# Patient Record
Sex: Female | Born: 1985 | Race: White | Hispanic: No | Marital: Single | Smoking: Current every day smoker
Health system: Southern US, Community
[De-identification: ages and names within clinical notes are randomized; demographics above are authoritative.]

---

## 2010-04-19 ENCOUNTER — Emergency Department (HOSPITAL_COMMUNITY): Admission: EM | Admit: 2010-04-19 | Discharge: 2010-04-19 | Payer: Self-pay | Admitting: Emergency Medicine

## 2013-01-25 ENCOUNTER — Ambulatory Visit: Payer: BC Managed Care – PPO

## 2013-01-25 ENCOUNTER — Ambulatory Visit (INDEPENDENT_AMBULATORY_CARE_PROVIDER_SITE_OTHER): Payer: BC Managed Care – PPO | Admitting: Family Medicine

## 2013-01-25 DIAGNOSIS — M533 Sacrococcygeal disorders, not elsewhere classified: Secondary | ICD-10-CM

## 2013-01-25 DIAGNOSIS — N912 Amenorrhea, unspecified: Secondary | ICD-10-CM

## 2013-01-25 DIAGNOSIS — M79609 Pain in unspecified limb: Secondary | ICD-10-CM

## 2013-01-25 MED ORDER — HYDROCODONE-ACETAMINOPHEN 5-325 MG PO TABS: 1.0000 | ORAL_TABLET | Freq: Four times a day (QID) | ORAL | Status: DC | PRN

## 2013-01-25 NOTE — Progress Notes (Signed)
   [address] [place] [address]   Phone [phone]  Subjective:    Patient ID: Julia Williamson, female    DOB: 08/25/1986, 27 y.o.   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  She also has pain in her L thenar eminence from when fell during a 5 mile run and landed on her hand.  She only has pain at her thenar eminence and bruising - she has no pain in the rest of her hand or wrist.    She is in a bodybuilding show in 7 weeks.  She has missed her menses but has had a neg pregnancy test - her nutritionist thinks probably related to 30# weight loss in 17 weeks with rapid muscle build with body building regimen and extreme diet changes over these last 17 wks.  She is also having problems with constipation as a result (so she is unsure whether she will have pain with passing a stool because she has not had one since Sunday)   Review of Systems  Musculoskeletal: Positive for arthralgias.       Objective:   Physical Exam  Vitals reviewed. Constitutional: She appears well-developed and well-nourished.  HENT:  Head: Normocephalic and atraumatic.  Right Ear: External ear normal.  Left Ear: External ear normal.  Musculoskeletal:       Back:       Hands: Skin: Skin is warm and dry.  Psychiatric: She has a normal mood and affect. Her behavior is normal. Judgment and thought content normal.   [facility] reading (PRIMARY) by  Dr. Neva Seat.  Normal hand - ? Anterior tilt of distal aspect of coccyx       Assessment & Plan:  Pain in hand, left - Plan: DG Hand Complete Left - contusion of thenar eminence. Pt will monitor and get a thumb spica if she continues to have pain.  Amenorrhea - Plan: POCT urine pregnancy - amenorrhea 2nd to nutritional/exercise  habits - pt to continue her nuvaring.  Coccyx pain - Plan: DG Sacrum/Coccyx, HYDROcodone-acetaminophen (NORCO/VICODIN) 5-325 MG per tablet - expect this to be a deep bone bruise possibly due to mal-position of coccyx possible related to recent fracture.  She has Naproxen that she should also use and then use the Norco for breakthrough pain.  Benny Lennert PA-C 01/26/2013 1:19 PM

## 2013-02-28 NOTE — Progress Notes (Signed)
Xray read and patient discussed with Ms. Weber. Agree with assessment and plan of care per her note.   

## 2013-07-02 ENCOUNTER — Ambulatory Visit (INDEPENDENT_AMBULATORY_CARE_PROVIDER_SITE_OTHER): Payer: BC Managed Care – PPO | Admitting: Family Medicine

## 2013-07-02 ENCOUNTER — Ambulatory Visit: Payer: Self-pay

## 2013-07-02 VITALS — BP 122/72 | HR 89 | Temp 98.6°F | Resp 18 | Ht 62.0 in | Wt 147.6 lb

## 2013-07-02 DIAGNOSIS — M79645 Pain in left finger(s): Secondary | ICD-10-CM

## 2013-07-02 DIAGNOSIS — T148XXA Other injury of unspecified body region, initial encounter: Secondary | ICD-10-CM

## 2013-07-02 DIAGNOSIS — M25522 Pain in left elbow: Secondary | ICD-10-CM

## 2013-07-02 DIAGNOSIS — M79642 Pain in left hand: Secondary | ICD-10-CM

## 2013-07-02 DIAGNOSIS — M533 Sacrococcygeal disorders, not elsewhere classified: Secondary | ICD-10-CM

## 2013-07-02 DIAGNOSIS — M79609 Pain in unspecified limb: Secondary | ICD-10-CM

## 2013-07-02 DIAGNOSIS — S0003XA Contusion of scalp, initial encounter: Secondary | ICD-10-CM

## 2013-07-02 DIAGNOSIS — S51002A Unspecified open wound of left elbow, initial encounter: Secondary | ICD-10-CM

## 2013-07-02 DIAGNOSIS — M25529 Pain in unspecified elbow: Secondary | ICD-10-CM

## 2013-07-02 DIAGNOSIS — S0093XA Contusion of unspecified part of head, initial encounter: Secondary | ICD-10-CM

## 2013-07-02 DIAGNOSIS — IMO0002 Reserved for concepts with insufficient information to code with codable children: Secondary | ICD-10-CM

## 2013-07-02 DIAGNOSIS — S51009A Unspecified open wound of unspecified elbow, initial encounter: Secondary | ICD-10-CM

## 2013-07-02 MED ORDER — HYDROCODONE-ACETAMINOPHEN 5-325 MG PO TABS: 1.0000 | ORAL_TABLET | Freq: Four times a day (QID) | ORAL | Status: AC | PRN

## 2013-07-02 MED ORDER — MUPIROCIN 2 % EX OINT: TOPICAL_OINTMENT | Freq: Every day | CUTANEOUS | Status: AC
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
## 2013-07-05 ENCOUNTER — Ambulatory Visit (INDEPENDENT_AMBULATORY_CARE_PROVIDER_SITE_OTHER): Payer: BC Managed Care – PPO | Admitting: Family Medicine

## 2013-07-05 VITALS — BP 102/58 | HR 79 | Temp 98.3°F | Resp 18 | Ht 62.0 in | Wt 144.0 lb

## 2013-07-05 DIAGNOSIS — T07XXXA Unspecified multiple injuries, initial encounter: Secondary | ICD-10-CM

## 2013-07-05 DIAGNOSIS — IMO0002 Reserved for concepts with insufficient information to code with codable children: Secondary | ICD-10-CM

## 2013-07-05 DIAGNOSIS — M25529 Pain in unspecified elbow: Secondary | ICD-10-CM

## 2013-07-05 DIAGNOSIS — S5002XS Contusion of left elbow, sequela: Secondary | ICD-10-CM

## 2013-07-05 DIAGNOSIS — M25522 Pain in left elbow: Secondary | ICD-10-CM

## 2013-07-05 NOTE — Progress Notes (Signed)
Subjective: Patient is returning today for a reevaluation of her injuries. Her various scrapes aren't doing better, though she still has a lot of aches and pains. The left elbow continues to be the source of primary pain, especially over the proximal ulna. She has been wearing the splint on the left arm and keeping it in a sling. 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  Objective: Left hand seems adequate tenderness improved. The forearm looks good. The elbow has a deeper abrasion on it, as well as a superficial 1 that is almost resolved. She has quite tender over the proximal ulna. X-ray reports were reviewed, and there is no definite fracture. The only questionable area was the radial neck, and did not appear to be a fracture according to the radiologist's. That area is nontender I do not feel like additional evaluation of it is indicated at this time.  Assessment: Left elbow contusion and pain Multiple abrasions, especially left elbow  Plan: If she continues to hurt significantly in the elbow by this time next week she is to return to consider repeat x-rays. Continue local care of wounds. We will leave the splint off the arm and let her start working it more.

## 2013-07-05 NOTE — Patient Instructions (Signed)
Continue keeping the wound dressed for a few more days until it is starting to heal a little better, then leave it open to the air.  Stay off work for the next 4 days  Return if elbow continues to cause pain so it can be reassessed  Return sooner if any other problems.  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

## 2013-10-13 ENCOUNTER — Emergency Department (INDEPENDENT_AMBULATORY_CARE_PROVIDER_SITE_OTHER)
Admission: EM | Admit: 2013-10-13 | Discharge: 2013-10-13 | Disposition: A | Discharge status: Home / Self Care | Payer: Worker's Compensation | Source: Home / Self Care | Attending: Emergency Medicine | Admitting: Emergency Medicine

## 2013-10-13 ENCOUNTER — Encounter (HOSPITAL_COMMUNITY): Payer: Self-pay | Admitting: Emergency Medicine

## 2013-10-13 ENCOUNTER — Emergency Department (INDEPENDENT_AMBULATORY_CARE_PROVIDER_SITE_OTHER): Payer: Worker's Compensation

## 2013-10-13 DIAGNOSIS — IMO0002 Reserved for concepts with insufficient information to code with codable children: Secondary | ICD-10-CM

## 2013-10-13 DIAGNOSIS — S53402A Unspecified sprain of left elbow, initial encounter: Secondary | ICD-10-CM

## 2013-10-13 DIAGNOSIS — S5402XA Injury of ulnar nerve at forearm level, left arm, initial encounter: Secondary | ICD-10-CM

## 2013-10-13 DIAGNOSIS — S5400XA Injury of ulnar nerve at forearm level, unspecified arm, initial encounter: Secondary | ICD-10-CM

## 2013-10-13 MED ORDER — HYDROCODONE-ACETAMINOPHEN 5-325 MG PO TABS
ORAL_TABLET | ORAL | Status: AC
  Filled 2013-10-13: qty 2

## 2013-10-13 MED ORDER — TRAMADOL HCL 50 MG PO TABS: 100.0000 mg | ORAL_TABLET | Freq: Three times a day (TID) | ORAL | Status: AC | PRN

## 2013-10-13 MED ORDER — HYDROCODONE-ACETAMINOPHEN 5-325 MG PO TABS
2.0000 | ORAL_TABLET | Freq: Once | ORAL | Status: AC
  Administered 2013-10-13: 2 via ORAL

## 2013-10-13 NOTE — ED Notes (Signed)
Left elbow pain, onset 7 am today

## 2013-10-13 NOTE — Discharge Instructions (Signed)
Joint Sprain °A sprain is a tear or stretch in the ligaments that hold a joint together. Severe sprains may need as long as 3-6 weeks of immobilization and/or exercises to heal completely. Sprained joints should be rested and protected. If not, they can become unstable and prone to re-injury. Proper treatment can reduce your pain, shorten the period of disability, and reduce the risk of repeated injuries. °TREATMENT  °· Rest and elevate the injured joint to reduce pain and swelling. °· Apply ice packs to the injury for 20-30 minutes every 2-3 hours for the next 2-3 days. °· Keep the injury wrapped in a compression bandage or splint as long as the joint is painful or as instructed by your caregiver. °· Do not use the injured joint until it is completely healed to prevent re-injury and chronic instability. Follow the instructions of your caregiver. °· Long-term sprain management may require exercises and/or treatment by a physical therapist. Taping or special braces may help stabilize the joint until it is completely better. °SEEK MEDICAL CARE IF:  °· You develop increased pain or swelling of the joint. °· You develop increasing redness and warmth of the joint. °· You develop a fever. °· It becomes stiff. °· Your hand or foot gets cold or numb. °Document Released: 10/28/2004 Document Revised: 12/13/2011 Document Reviewed: 10/07/2008 °[employer]® Patient Information ©2014 [employer]. ° °
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
## 2014-06-02 IMAGING — CR DG ELBOW COMPLETE 3+V*L*
2 series · 2 of 2 positions shown · non-contrast
Comparison: None.

CLINICAL DATA: Left hand pain after trauma

LEFT ELBOW - COMPLETE 3+ VIEW

[lateral]
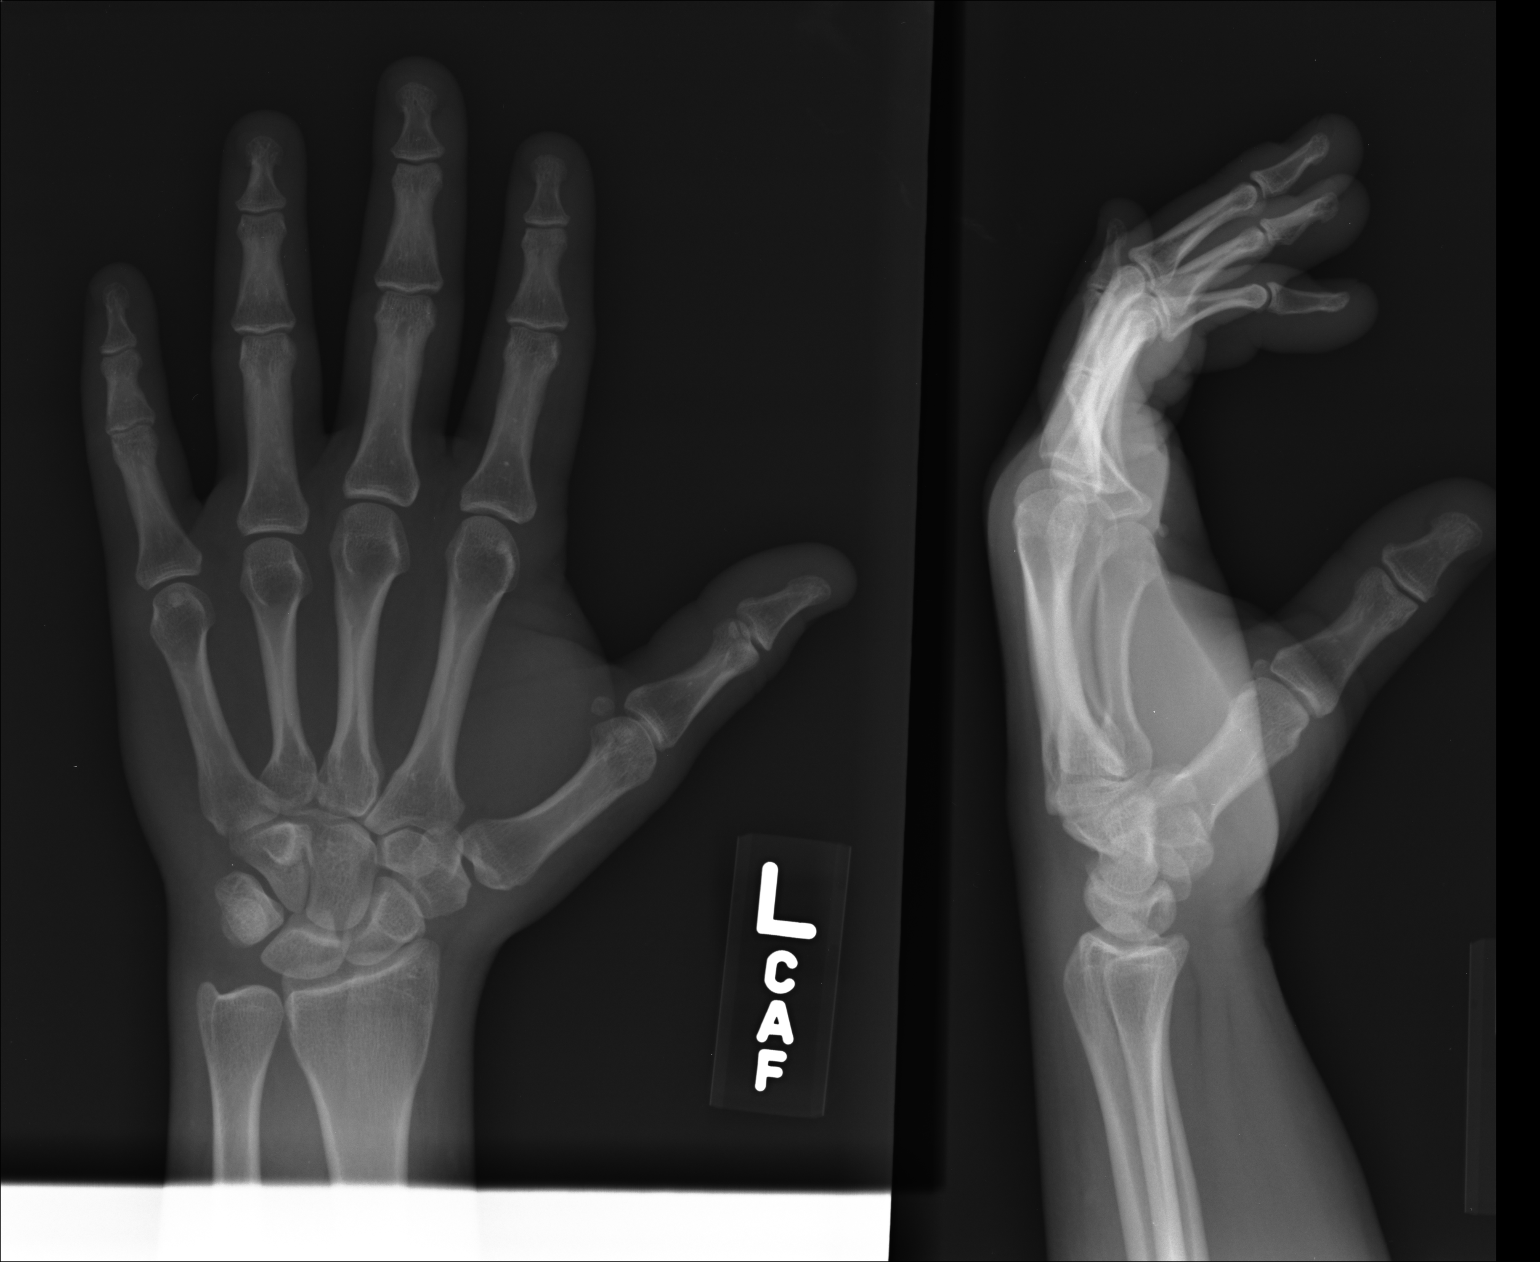

[AP]
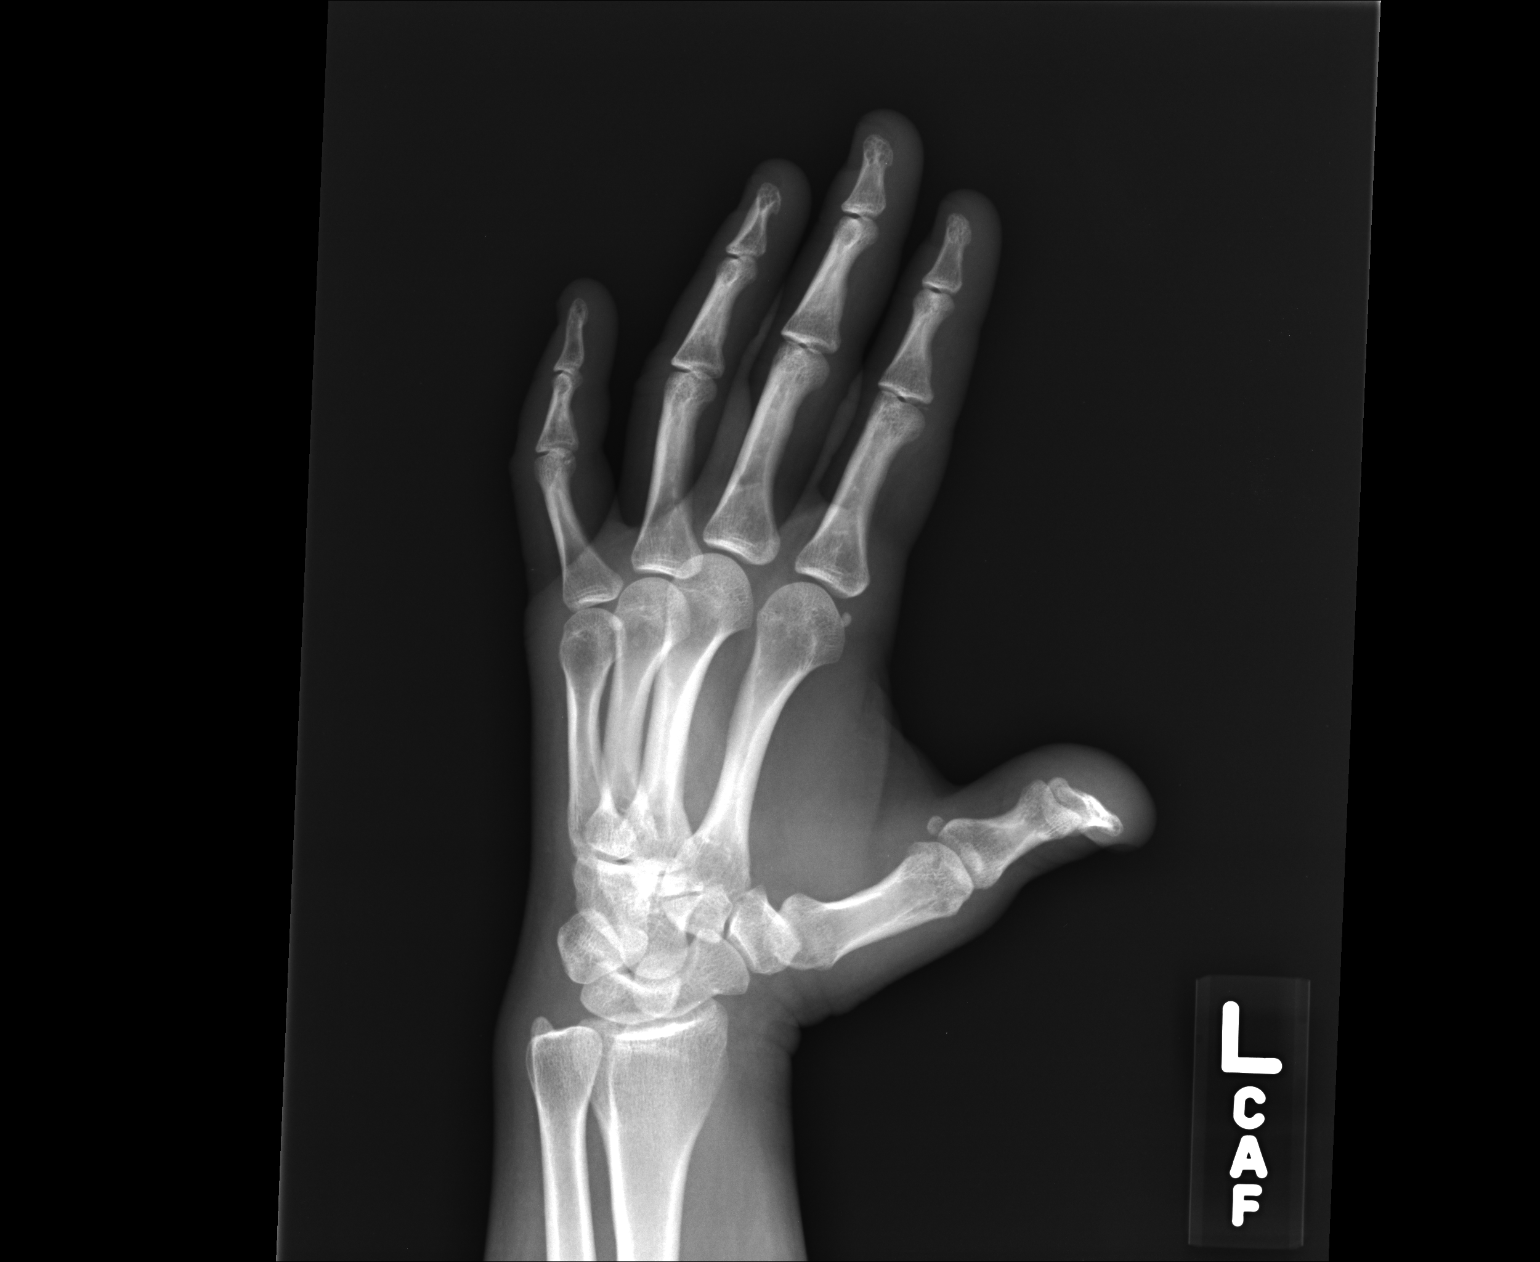

[2 of 2 positions shown; findings below may reference images not displayed]

FINDINGS: No fracture or dislocation.
IMPRESSION: Negative

## 2014-06-02 IMAGING — CR DG HAND COMPLETE 3+V*L*
2 series · 2 of 2 positions shown · non-contrast
Comparison: None.

CLINICAL DATA: Pain left elbow

LEFT HAND - COMPLETE 3+ VIEW

[oblique]
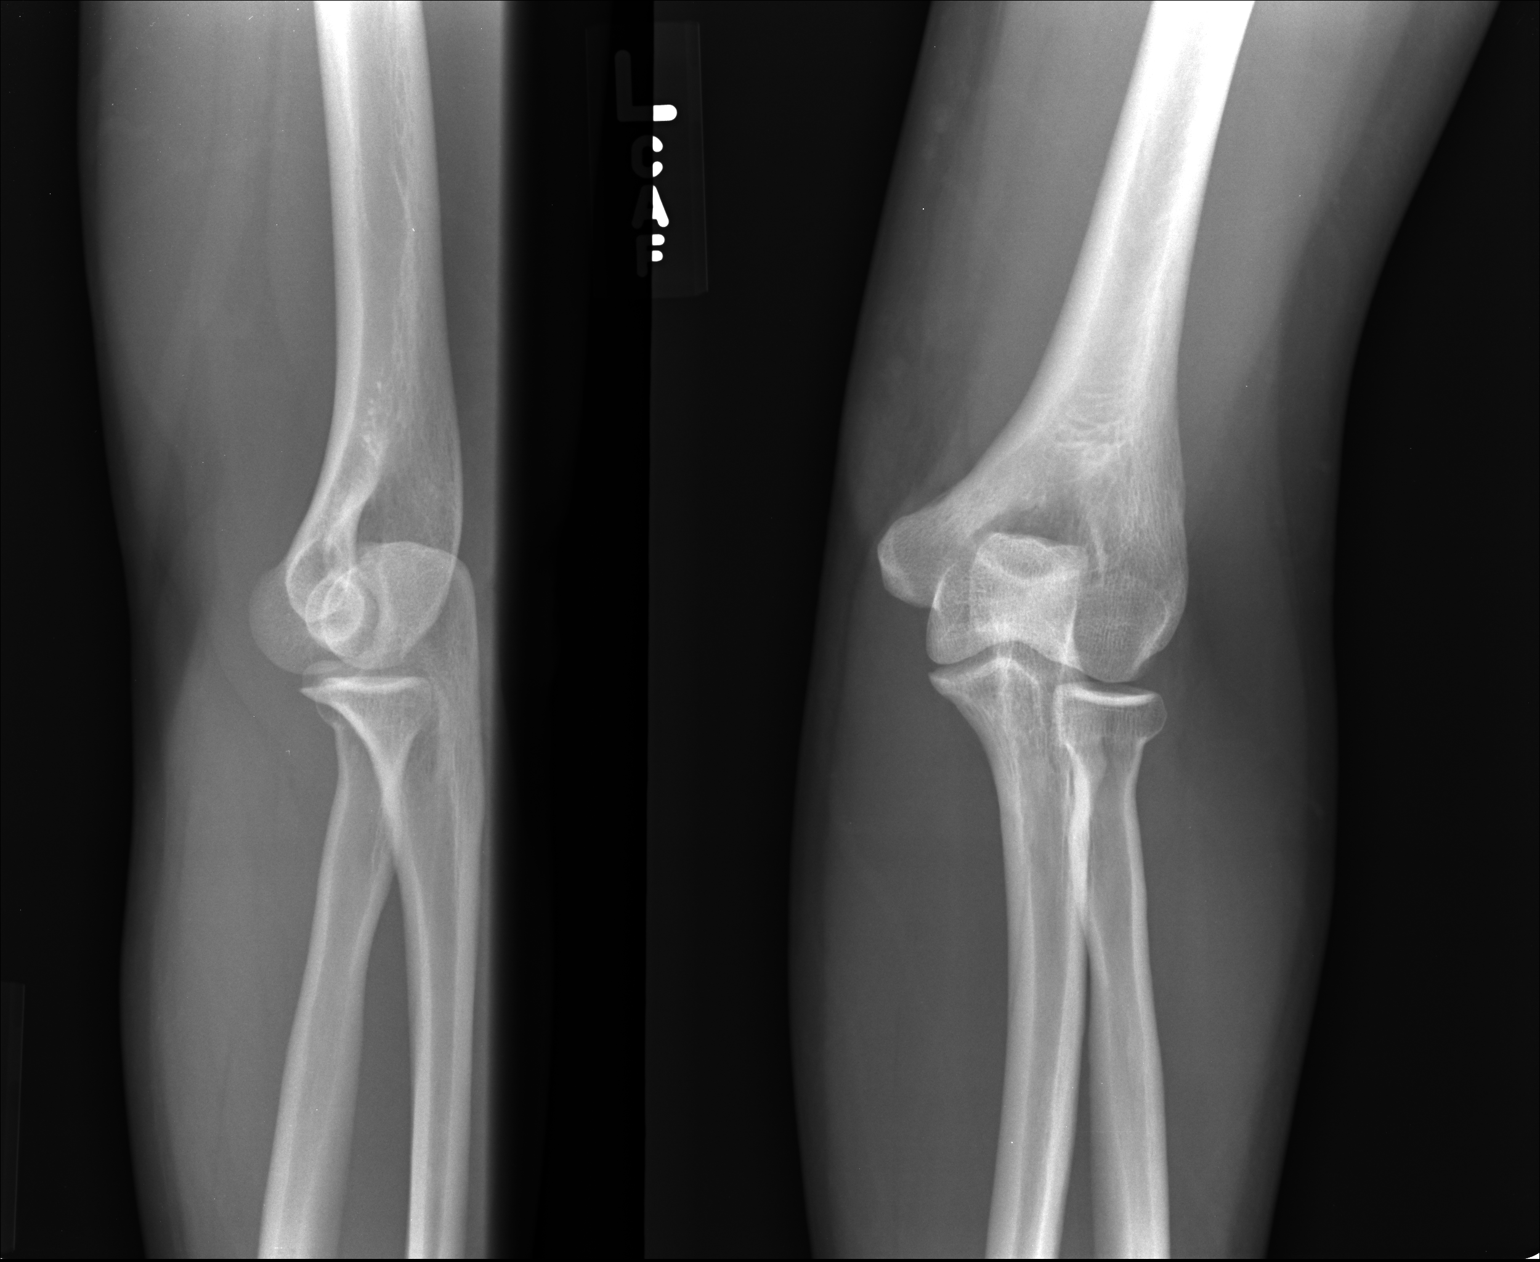

[PA]
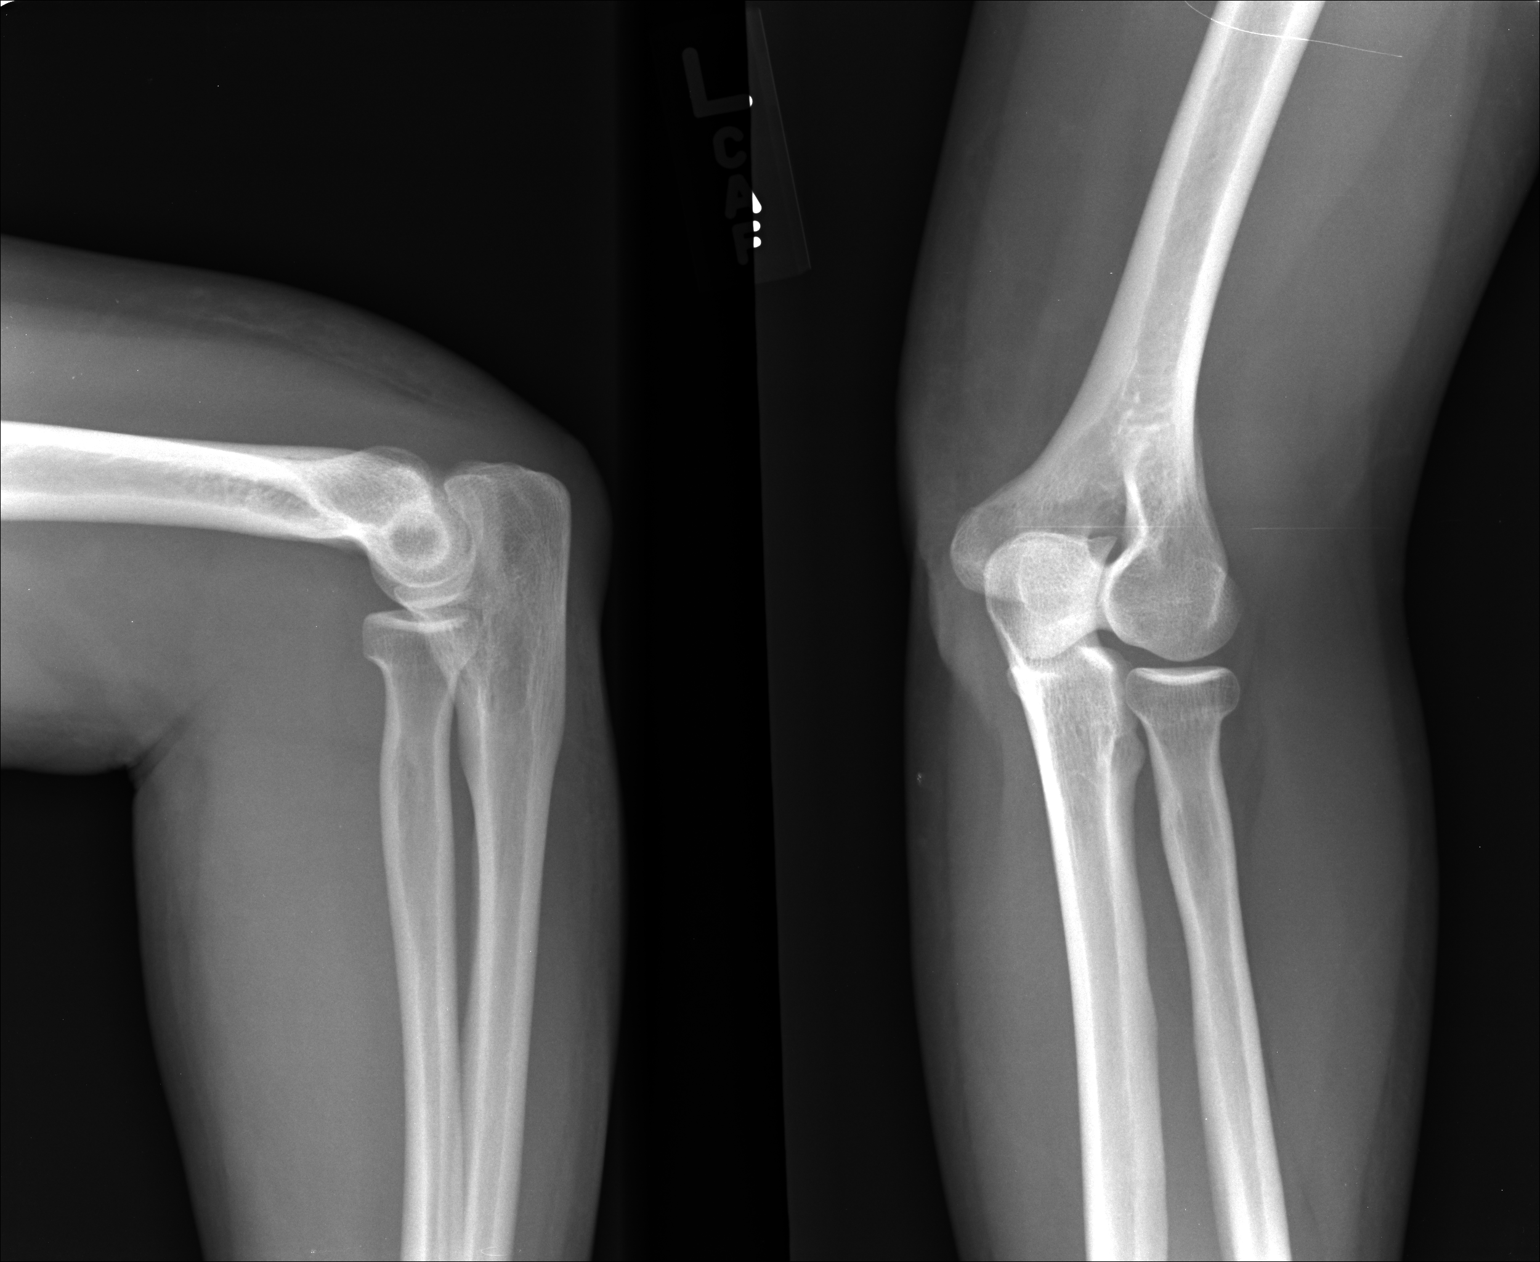

[2 of 2 positions shown; findings below may reference images not displayed]

FINDINGS: Mild cortical angulation at the radial neck.  No defined
fracture line.  No other focal osseous abnormalities.  No
dislocation or joint effusion.
IMPRESSION: Minimal irregularity of the radial neck likely not indicative of a
fracture given the absence of joint effusion. If symptoms persist,
consider follow-up radiograph or CT imaging.

## 2015-01-31 ENCOUNTER — Encounter (INDEPENDENT_AMBULATORY_CARE_PROVIDER_SITE_OTHER): Payer: Self-pay | Admitting: *Deleted

## 2015-01-31 NOTE — Telephone Encounter (Signed)
This encounter was created in error - please disregard.
